# Patient Record
Sex: Female | Born: 1995 | Race: Black or African American | Hispanic: No | Marital: Single | Smoking: Never smoker
Health system: Southern US, Community
[De-identification: ages and names within clinical notes are randomized; demographics above are authoritative.]

---

## 2015-07-01 DIAGNOSIS — R002 Palpitations: Secondary | ICD-10-CM | POA: Diagnosis not present

## 2015-07-26 ENCOUNTER — Ambulatory Visit: Payer: Self-pay | Admitting: Cardiology

## 2015-07-30 ENCOUNTER — Ambulatory Visit: Payer: Self-pay | Admitting: Cardiology

## 2015-08-15 ENCOUNTER — Encounter: Payer: Self-pay | Admitting: Cardiology

## 2015-08-20 ENCOUNTER — Encounter: Payer: Self-pay | Admitting: Cardiology

## 2015-08-20 ENCOUNTER — Ambulatory Visit (INDEPENDENT_AMBULATORY_CARE_PROVIDER_SITE_OTHER): Payer: PRIVATE HEALTH INSURANCE | Admitting: Cardiology

## 2015-08-20 ENCOUNTER — Ambulatory Visit (INDEPENDENT_AMBULATORY_CARE_PROVIDER_SITE_OTHER): Payer: PRIVATE HEALTH INSURANCE

## 2015-08-20 VITALS — BP 102/68 | HR 63 | Ht 65.0 in | Wt 152.4 lb

## 2015-08-20 DIAGNOSIS — R002 Palpitations: Secondary | ICD-10-CM

## 2015-08-20 DIAGNOSIS — R072 Precordial pain: Secondary | ICD-10-CM

## 2015-08-20 DIAGNOSIS — R079 Chest pain, unspecified: Secondary | ICD-10-CM | POA: Insufficient documentation

## 2015-08-20 NOTE — Patient Instructions (Signed)
Medication Instructions:   Your physician recommends that you continue on your current medications as directed. Please refer to the Current Medication list given to you today.     Testing/Procedures:  Your physician has requested that you have an echocardiogram. Echocardiography is a painless test that uses sound waves to create images of your heart. It provides your doctor with information about the size and shape of your heart and how well your heart's chambers and valves are working. This procedure takes approximately one hour. There are no restrictions for this procedure.   Your physician has recommended that you wear a 48 HOUR holter monitor. Holter monitors are medical devices that record the heart's electrical activity. Doctors most often use these monitors to diagnose arrhythmias. Arrhythmias are problems with the speed or rhythm of the heartbeat. The monitor is a small, portable device. You can wear one while you do your normal daily activities. This is usually used to diagnose what is causing palpitations/syncope (passing out).    Follow-Up:  AS NEEDED WITH DR Delton SeeNELSON       If you need a refill on your cardiac medications before your next appointment, please call your pharmacy.

## 2015-08-20 NOTE — Progress Notes (Signed)
Patient ID: Melanie Wilcox, female   DOB: 26-Oct-1995, 19 y.o.   MRN: [id number]      Cardiology Office Note  Date:  08/20/2015   ID:  Melanie Wilcox, DOB 26-Oct-1995, MRN [id number]  PCP:  No primary care provider on file.  Cardiologist:  Lars MassonNELSON, Tia Gelb H, MD   Chief Complaint  Patient presents with  . Bradycardia  Chest tightness  History of Present Illness: Melanie Wilcox is a very pleasant 19 y.o. female, who wants to go into dentistry school who presents for evaluation of palpitations that feel like slow heart beats, skipped beats associated with chest tightness but never dizziness, SOB or syncope. Thy started about 2 months ago, the longest episode was this Monday and lasted 15 seconds. [place] [employer] [place].  Her original TSH early in September was too low, but repeat was normal as well as fT4.  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 No prior h/o heart problems, no family h/o heart disease or SCD.  No use of drugs.   No past medical history on file.  No past surgical history on file.  No current outpatient prescriptions on file.   No current facility-administered medications for this visit.   Allergies:   Review of patient's allergies indicates no known allergies.   Social History:  The patient  reports that she has never smoked. She does not have any smokeless tobacco history on file. She reports that she does not drink alcohol.   Family History:  The patient's family history includes Healthy in her brother, father, mother, sister, and sister.   ROS:  Please see the history of present illness.   Otherwise, review of systems are positive for none.   All other systems are reviewed and negative.   PHYSICAL EXAM: VS:  BP 102/68 mmHg  Pulse 63  Ht 5\' 5"  (1.651 m)  Wt 152 lb 6.4 oz (69.128 kg)  BMI 25.36 kg/m2 , BMI Body mass index is 25.36 kg/(m^2). GEN: Well nourished, well developed, in no acute  distress HEENT: normal Neck: no JVD, carotid bruits, or masses Cardiac: RRR; no murmurs, rubs, or gallops,no edema  Respiratory:  clear to auscultation bilaterally, normal work of breathing GI: soft, nontender, nondistended, + BS MS: no deformity or atrophy Skin: warm and dry, no rash Neuro:  Strength and sensation are intact Psych: euthymic mood, full affect  EKG:  EKG is ordered today. The ekg ordered today demonstrates SR, with sinus arrhythmia, right axis, otherwise normal  Recent Labs: No results found for requested labs within last 365 days.   Lipid Panel No results found for: CHOL, TRIG, HDL, CHOLHDL, VLDL, LDLCALC, LDLDIRECT   Wt Readings from Last 3 Encounters:  08/20/15 152 lb 6.4 oz (69.128 kg)      ASSESSMENT AND PLAN:  1.  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 If normal, this visit is mostly about reassurance.  2. Chest tightness - schedule an echocardiogram to evaluate for possible anomalous coronaries.   Follow up as needed. We will call with the results.   Signed, Lars MassonNELSON, Chason Mciver H, MD  08/20/2015 8:48 AM    [facility] [address], [place], [place]  [address] Phone: [phone]; Fax: [phone]

## 2015-08-26 ENCOUNTER — Ambulatory Visit (HOSPITAL_COMMUNITY): Payer: PRIVATE HEALTH INSURANCE | Attending: Cardiovascular Disease

## 2015-08-26 ENCOUNTER — Other Ambulatory Visit: Payer: Self-pay

## 2015-08-26 ENCOUNTER — Telehealth: Payer: Self-pay | Admitting: Cardiology

## 2015-08-26 DIAGNOSIS — R079 Chest pain, unspecified: Secondary | ICD-10-CM | POA: Insufficient documentation

## 2015-08-26 DIAGNOSIS — R072 Precordial pain: Secondary | ICD-10-CM | POA: Diagnosis not present

## 2015-08-26 DIAGNOSIS — R002 Palpitations: Secondary | ICD-10-CM | POA: Diagnosis not present

## 2015-08-26 NOTE — Telephone Encounter (Signed)
Follow Up   Pt returned the call. Please call

## 2015-08-26 NOTE — Telephone Encounter (Signed)
Notified the pt that per Dr Delton SeeNelson her echo was normal.  Pt verbalized understanding.

## 2015-08-28 ENCOUNTER — Other Ambulatory Visit (HOSPITAL_COMMUNITY): Payer: PRIVATE HEALTH INSURANCE

## 2016-02-03 ENCOUNTER — Other Ambulatory Visit: Payer: Self-pay | Admitting: Family Medicine

## 2016-02-03 ENCOUNTER — Ambulatory Visit
Admission: RE | Admit: 2016-02-03 | Discharge: 2016-02-03 | Disposition: A | Discharge status: Home / Self Care | Payer: PRIVATE HEALTH INSURANCE | Source: Ambulatory Visit | Attending: Family Medicine | Admitting: Family Medicine

## 2016-02-03 ENCOUNTER — Ambulatory Visit (INDEPENDENT_AMBULATORY_CARE_PROVIDER_SITE_OTHER): Payer: Self-pay | Admitting: Family Medicine

## 2016-02-03 DIAGNOSIS — M25562 Pain in left knee: Secondary | ICD-10-CM

## 2016-02-03 NOTE — Progress Notes (Signed)
Patient presents today with symptoms of left posterior knee pain. Patient states that she's had this pain for the last 3-4 weeks. She does not recall an acute event that caused the pain. They've been treating it like a hamstring strain since her pain began. She has noticed recently that the left knee has also started to feel uncomfortable. She denies any knee injury. She denies any history of DVT or popliteal cyst. She denies any trauma to the knee. She denies any family history of DVT. She runs many events and track and field.  ROS: Negative except mentioned above. Vitals as per Epic.  GENERAL: NAD MSK: mild fullness and tenderness along the posterior aspect of the knee, pain with flexion of the knee, no knee effusion or jointline tenderness, -McMurrays, -Lachman, -Homans, nv intact  NEURO: CN II-XII grossly intact   A/P: Left Knee Posterior Pain- will do ultrasound to rule out Baker's cyst, DVT. Will discuss results with patient and treatment plan after results have been sent to me.

## 2016-02-04 ENCOUNTER — Other Ambulatory Visit: Payer: Self-pay | Admitting: Family Medicine

## 2016-02-04 DIAGNOSIS — M25562 Pain in left knee: Secondary | ICD-10-CM

## 2016-02-04 NOTE — Progress Notes (Signed)
Patient ID: Jerrell MylarDesiree Red, female   DOB: [birth year], 20 y.o.   MRN: [id number]  Patient presents today with symptoms of posterior knee pain. Patient states that she's had the symptoms for the last few weeks. Her trainer has been treating it like a hamstring strain. Patient runs many events in track and field. She denies any history of DVT. She denies any family history of DVT. When she flexes and extends her legs she does have pain in the posterior aspect of the knee. She denies any pain in the mid hamstring or proximal hamstring. She denies ever having any bruising in the area. She does describe some anterior knee pain as well along the patella tendon which is recent.  ROS: Negative except mentioned above.  Vitals as per Epic.  GENERAL: NAD RESP: CTA B CARD: RRR EXTREM: LLE- FROM, but has discomfort with flexion in the posterior knee region, mild tenderness and swelling in the posterior aspect of the knee, no significant effusion, no joint line tenderness, negative Lachman, negative drawer, negative McMurray, no significant laxity with varus or valgus stress, negative Homans, NV intact NEURO: CN II-XII grossly intact   A/P: Left Posterior Knee Pain- we'll get a Doppler ultrasound to evaluate for DVT, Baker's cyst. If negative findings we'll proceed with MRI to evaluate if hamstring and knee. Activity only as tolerated if Doppler is negative.

## 2016-07-16 ENCOUNTER — Encounter: Payer: Self-pay | Admitting: Family Medicine

## 2016-07-16 ENCOUNTER — Ambulatory Visit (INDEPENDENT_AMBULATORY_CARE_PROVIDER_SITE_OTHER): Payer: PRIVATE HEALTH INSURANCE | Admitting: Family Medicine

## 2016-07-16 VITALS — BP 112/65 | HR 69 | Temp 98.8°F | Resp 16

## 2016-07-16 DIAGNOSIS — J069 Acute upper respiratory infection, unspecified: Secondary | ICD-10-CM

## 2016-07-16 NOTE — Progress Notes (Signed)
Patient presents today for her symptoms of sore throat, fever, postnasal drip. Patient has had symptoms for the last 2 days. She denies any severe headache, chest pain, shortness of breath, nausea, vomiting, diarrhea. Patient took acetaminophen earlier today.  ROS: Negative except mentioned above.  Vitals as per Epic.  GENERAL: NAD HEENT: mild pharyngeal erythema, no exudate, no erythema of TMs, no significant cervical LAD RESP: CTA B CARD: RRR NEURO: CN II-XII grossly intact   A/P: Viral Illness -will treat with Ibuprofen, Claritin, rest, hydration, no athletic activity a febrile. Patient is not to go to class if she is febrile also. Seek medical attention if symptoms persist or worsen as discussed.

## 2017-01-07 ENCOUNTER — Encounter: Payer: Self-pay | Admitting: Family Medicine

## 2017-01-07 ENCOUNTER — Ambulatory Visit (INDEPENDENT_AMBULATORY_CARE_PROVIDER_SITE_OTHER): Payer: BLUE CROSS/BLUE SHIELD | Admitting: Family Medicine

## 2017-01-07 DIAGNOSIS — S76301A Unspecified injury of muscle, fascia and tendon of the posterior muscle group at thigh level, right thigh, initial encounter: Secondary | ICD-10-CM

## 2017-01-07 DIAGNOSIS — S76309A Unspecified injury of muscle, fascia and tendon of the posterior muscle group at thigh level, unspecified thigh, initial encounter: Secondary | ICD-10-CM

## 2017-01-07 MED ORDER — NAPROXEN 500 MG PO TABS: 500.0000 mg | ORAL_TABLET | Freq: Two times a day (BID) | ORAL | 0 refills | Status: DC

## 2017-01-07 NOTE — Progress Notes (Signed)
Patient presents today with symptoms of right hamstring pain. She states that she was running the 41832m and stopped running at 23932m due to hamstring pain back mid January. She has been unable to return to her events due to continued discomfort in the area. She denies any significant swelling or bruising of the area when she had the injury back in January. She admits to having a previous injury last year.  ROS: Negative except mentioned above. Vitals as per Epic. GENERAL: NAD MSK: Right hamstring - no ecchymosis or swelling appreciated, mild tenderness in the distal hamstring muscle group mostly medially, decreased strength with testing right first left, NV intact NEURO: CN II-XII grossly intact   A/P: Right hamstring injury - given patient's symptoms and exam will do further imaging with MRI, patient is not running in the meet this weekend, will continue to rehabilitation with trainer, discussed plan with trainer, NSAIDs when necessary. Seek medical attention for any acute problems.

## 2017-01-15 ENCOUNTER — Ambulatory Visit: Payer: PRIVATE HEALTH INSURANCE

## 2017-01-23 ENCOUNTER — Ambulatory Visit
Admission: RE | Admit: 2017-01-23 | Discharge: 2017-01-23 | Disposition: A | Discharge status: Home / Self Care | Payer: BLUE CROSS/BLUE SHIELD | Source: Ambulatory Visit | Attending: Family Medicine | Admitting: Family Medicine

## 2017-01-23 DIAGNOSIS — X58XXXA Exposure to other specified factors, initial encounter: Secondary | ICD-10-CM | POA: Insufficient documentation

## 2017-01-23 DIAGNOSIS — S76309A Unspecified injury of muscle, fascia and tendon of the posterior muscle group at thigh level, unspecified thigh, initial encounter: Secondary | ICD-10-CM

## 2017-02-01 IMAGING — US US EXTREM LOW VENOUS*L*
1 series · 14 of 24 positions shown · non-contrast
Comparison: None.

CLINICAL DATA: Posterior left knee pain for 3-4 weeks.

EXAM:
LEFT LOWER EXTREMITY VENOUS DOPPLER ULTRASOUND
TECHNIQUE: Gray-scale sonography with graded compression, as well as color
Doppler and duplex ultrasound, were performed to evaluate the deep
venous system from the level of the common femoral vein through the
popliteal and proximal calf veins. Spectral Doppler was utilized to
evaluate flow at rest and with distal augmentation maneuvers.

[Series 1: us extrem low venous*left* · 0.07mm/px · 14 of 32 slices shown]
[im 1/32]
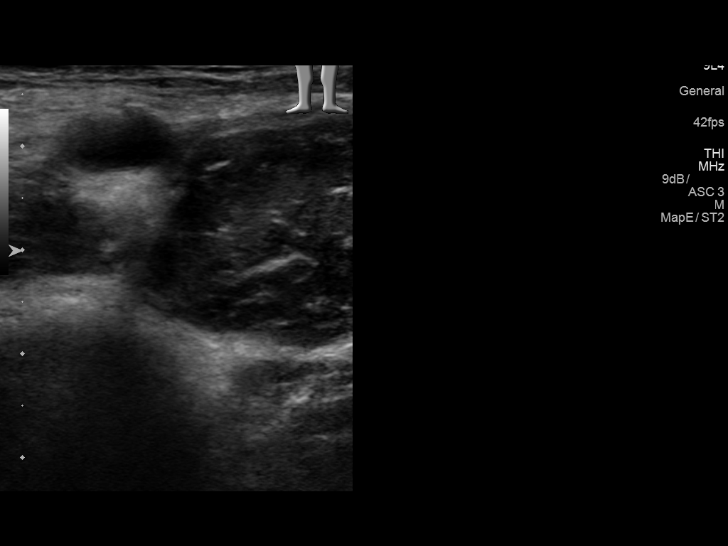
[im 3/32]
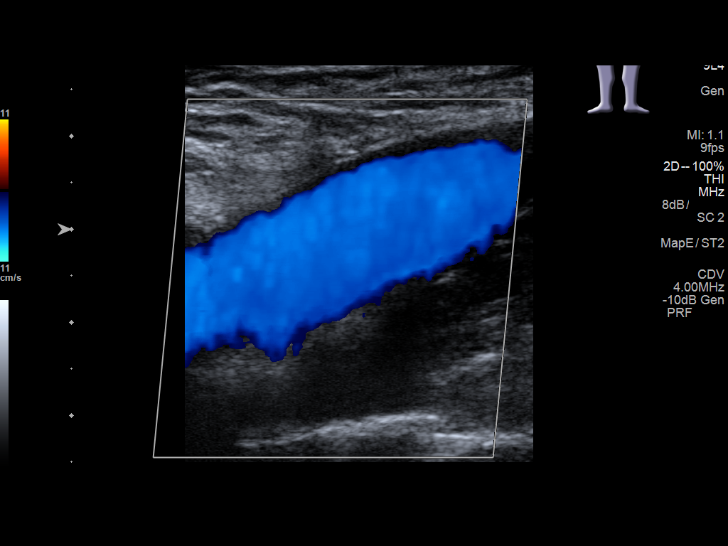
[im 6/32]
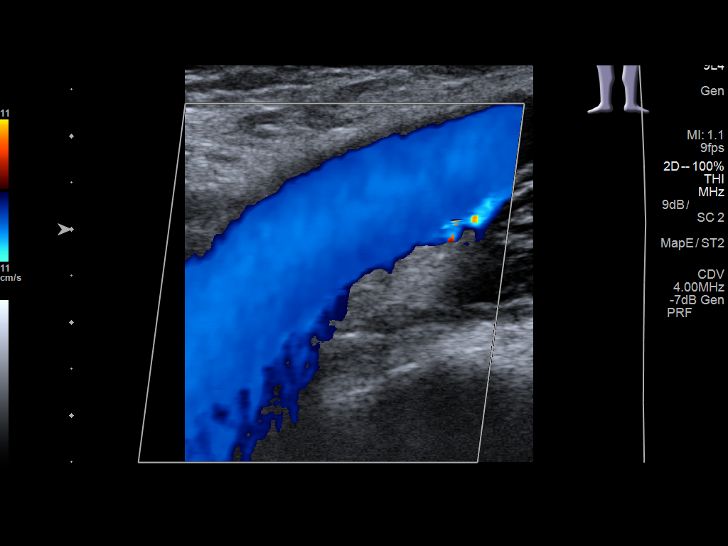
[im 9/32]
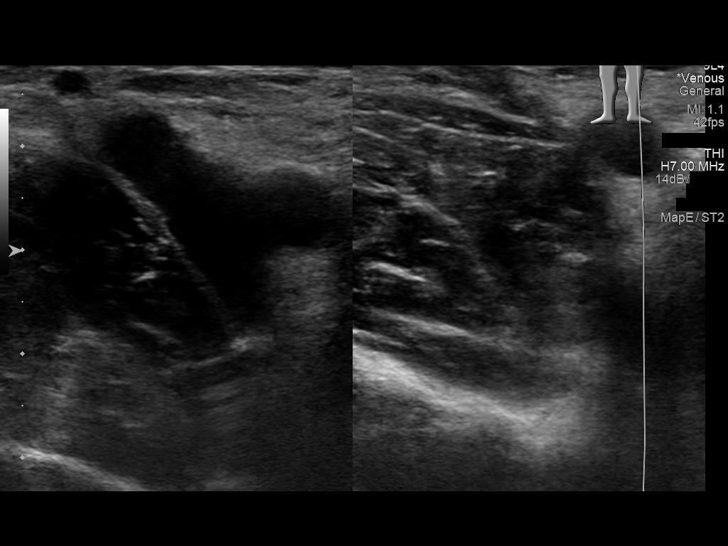
[im 10/32]
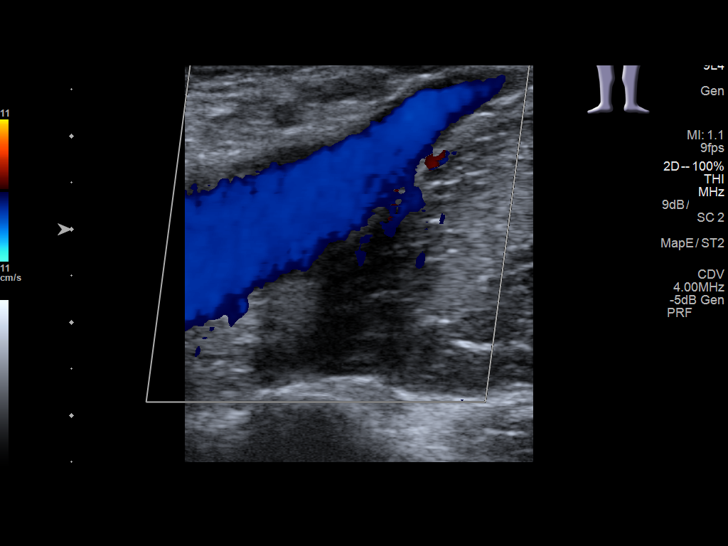
[im 13/32]
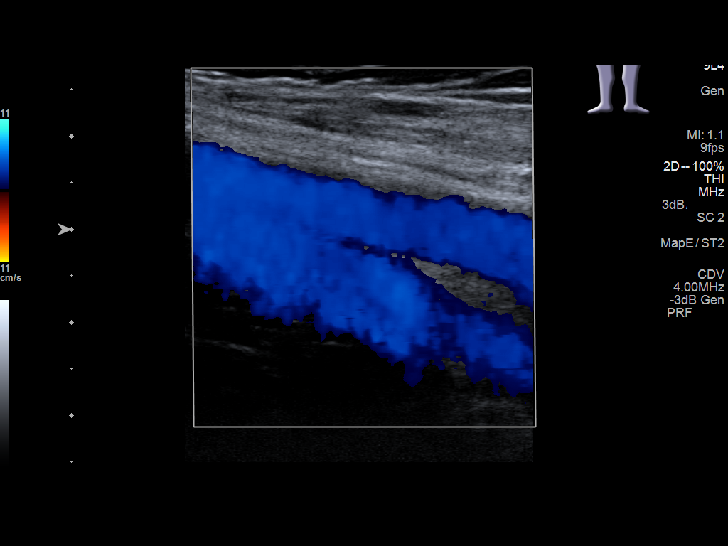
[im 15/32]
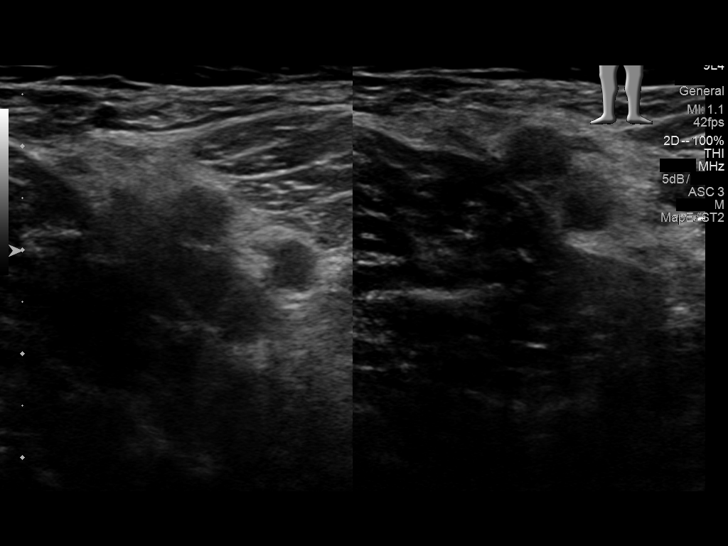
[im 17/32]
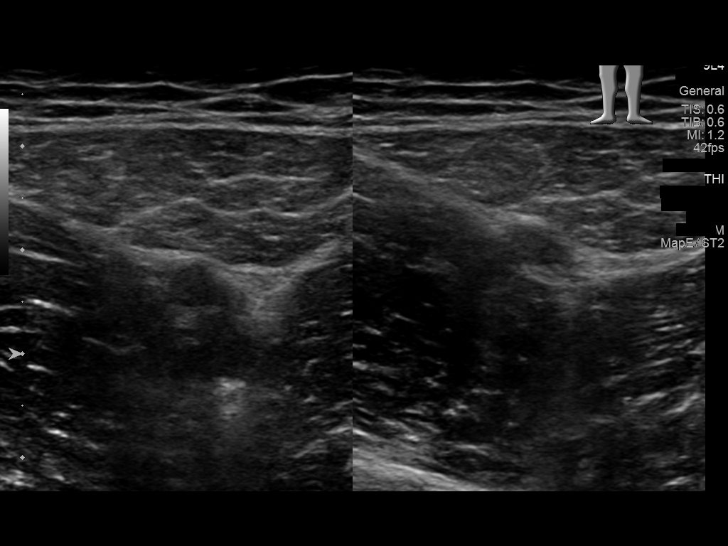
[im 19/32]
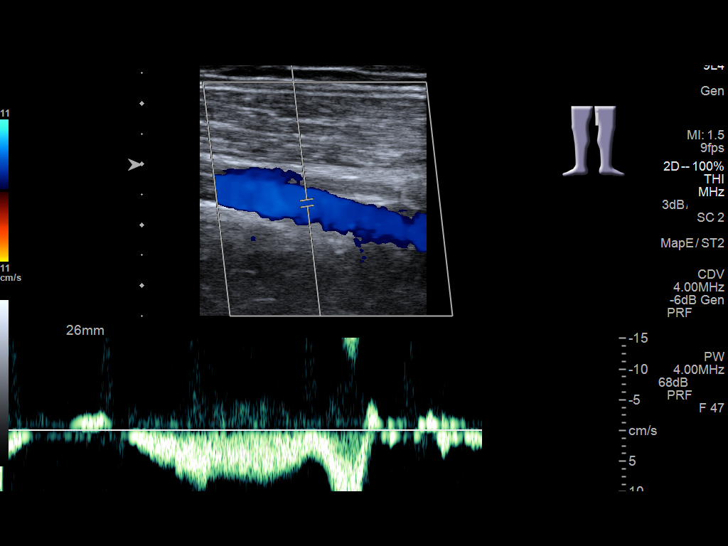
[im 22/32]
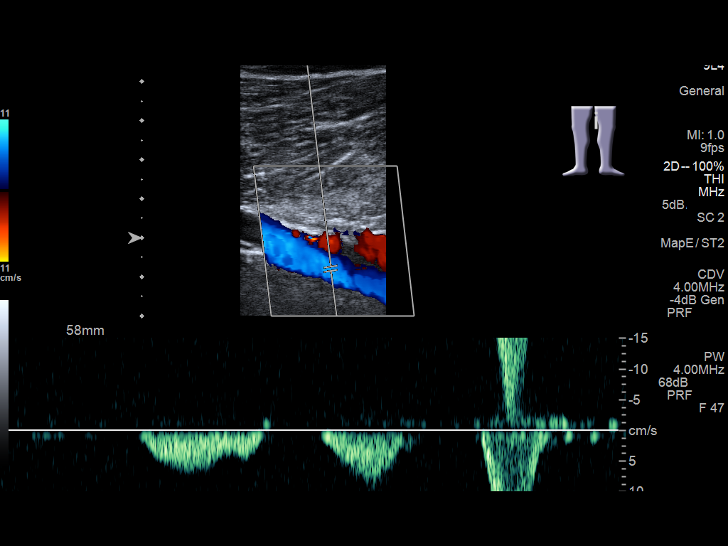
[im 25/32]
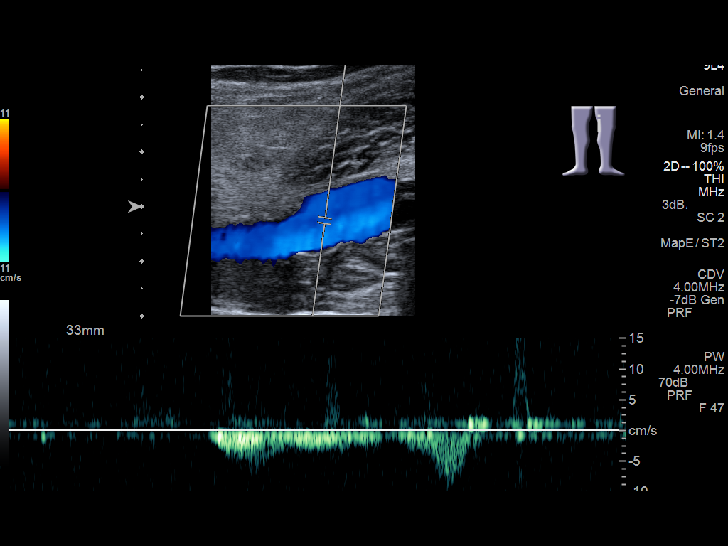
[im 26/32]
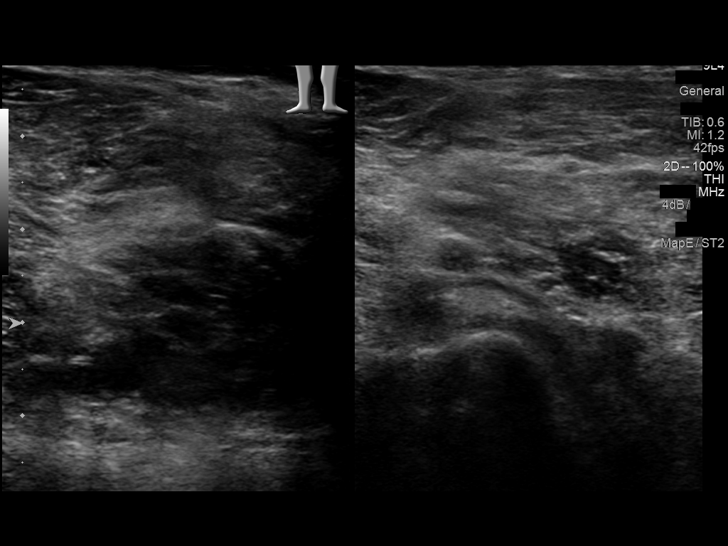
[im 29/32]
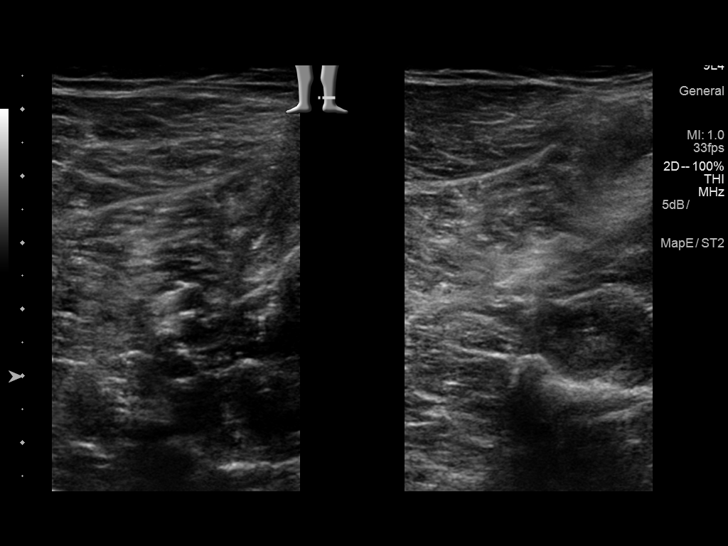
[im 32/32]
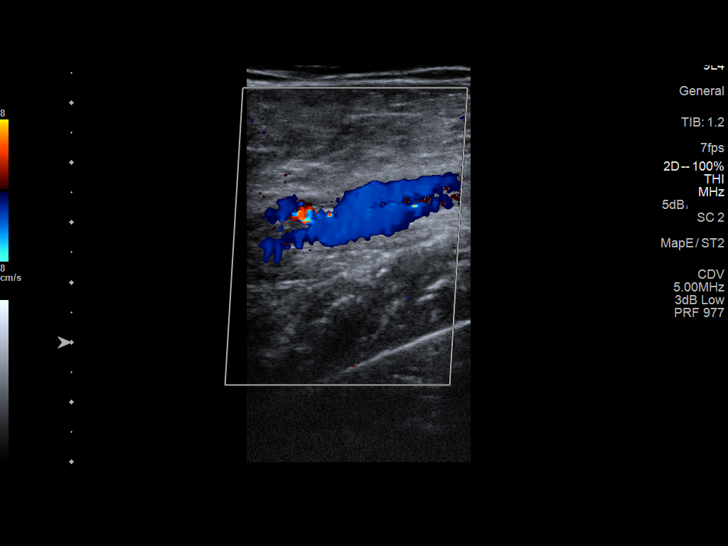

[14 of 24 positions shown; findings below may reference images not displayed]

FINDINGS: Right common femoral vein is patent without thrombus.

Normal compressibility, augmentation and color Doppler flow in the
left common femoral vein, left femoral vein and left popliteal vein.
The left saphenofemoral junction is patent. Left profunda femoral
vein is patent without thrombus. Visualized left deep calf veins are
patent without thrombus.
IMPRESSION: Negative for deep venous thrombosis in left lower extremity.

## 2017-02-09 ENCOUNTER — Ambulatory Visit: Payer: BLUE CROSS/BLUE SHIELD | Admitting: Family Medicine

## 2017-02-11 ENCOUNTER — Other Ambulatory Visit: Payer: Self-pay | Admitting: Family Medicine

## 2017-02-11 DIAGNOSIS — M25561 Pain in right knee: Secondary | ICD-10-CM

## 2017-02-18 ENCOUNTER — Ambulatory Visit: Payer: BLUE CROSS/BLUE SHIELD

## 2017-02-25 ENCOUNTER — Ambulatory Visit: Payer: BLUE CROSS/BLUE SHIELD

## 2017-09-10 ENCOUNTER — Encounter: Payer: Self-pay | Admitting: Family Medicine

## 2017-09-10 ENCOUNTER — Ambulatory Visit (INDEPENDENT_AMBULATORY_CARE_PROVIDER_SITE_OTHER): Payer: BLUE CROSS/BLUE SHIELD | Admitting: Family Medicine

## 2017-09-10 VITALS — BP 116/76 | HR 74 | Temp 97.6°F | Resp 14

## 2017-09-10 DIAGNOSIS — J069 Acute upper respiratory infection, unspecified: Secondary | ICD-10-CM

## 2017-09-10 MED ORDER — AZITHROMYCIN 250 MG PO TABS: ORAL_TABLET | ORAL | 0 refills | Status: AC

## 2017-09-10 NOTE — Progress Notes (Signed)
Patient presents today with symptoms of mild productive cough and nasal congestion. The mucus has been more colored. Patient states that she has had symptoms for the last week. She denies any chest pain or shortness of breath. She states that at times with her coughing she does feel her chest is heavy. She denies any fever or night sweats. She has no history of asthma. She denies any possible new allergens. She does state that while she was home for Thanksgiving there were a few people that were sick. She has been taking Mucinex for her symptoms. Her nighttime cough is keeping her up at night.  ROS: Negative except mentioned above. Vitals as per Epic. GENERAL: NAD HEENT: mild pharyngeal erythema, no exudate, no erythema of TMs, no cervical LAD RESP: CTA B, no accessory muscle use, no wheezing appreciated CARD: RRR NEURO: CN II-XII grossly intact   A/P: URI - will treat with Z-Pak, over-the-counter cough suppressant during the daytime and Tussionex at nighttime if needed, Claritin for postnasal drip, rest, hydration, seek medical attention if symptoms persist or worsen as discussed. No last or athletic activity if febrile.

## 2017-11-05 ENCOUNTER — Other Ambulatory Visit: Payer: Self-pay | Admitting: Family Medicine

## 2017-11-05 ENCOUNTER — Encounter: Payer: Self-pay | Admitting: Family Medicine

## 2017-11-05 ENCOUNTER — Ambulatory Visit (INDEPENDENT_AMBULATORY_CARE_PROVIDER_SITE_OTHER): Payer: PRIVATE HEALTH INSURANCE | Admitting: Family Medicine

## 2017-11-05 DIAGNOSIS — M25561 Pain in right knee: Secondary | ICD-10-CM

## 2017-11-05 MED ORDER — NAPROXEN 500 MG PO TABS: 500.0000 mg | ORAL_TABLET | Freq: Two times a day (BID) | ORAL | 0 refills | Status: AC

## 2017-11-06 LAB — VITAMIN D 25 HYDROXY (VIT D DEFICIENCY, FRACTURES): VIT D 25 HYDROXY: 11.6 ng/mL — AB (ref 30.0–100.0)

## 2017-11-08 ENCOUNTER — Ambulatory Visit
Admission: RE | Admit: 2017-11-08 | Discharge: 2017-11-08 | Disposition: A | Discharge status: Home / Self Care | Payer: BLUE CROSS/BLUE SHIELD | Source: Ambulatory Visit | Attending: Family Medicine | Admitting: Family Medicine

## 2017-11-08 DIAGNOSIS — M25561 Pain in right knee: Secondary | ICD-10-CM

## 2017-11-11 ENCOUNTER — Ambulatory Visit: Payer: PRIVATE HEALTH INSURANCE

## 2017-11-16 ENCOUNTER — Ambulatory Visit
Admission: RE | Admit: 2017-11-16 | Discharge: 2017-11-16 | Disposition: A | Discharge status: Home / Self Care | Payer: BLUE CROSS/BLUE SHIELD | Source: Ambulatory Visit | Attending: Family Medicine | Admitting: Family Medicine

## 2017-11-16 DIAGNOSIS — M9250 Juvenile osteochondrosis of tibia and fibula, unspecified leg: Secondary | ICD-10-CM | POA: Diagnosis not present

## 2017-11-16 DIAGNOSIS — R600 Localized edema: Secondary | ICD-10-CM | POA: Insufficient documentation

## 2017-11-16 DIAGNOSIS — M25561 Pain in right knee: Secondary | ICD-10-CM

## 2017-11-19 NOTE — Progress Notes (Signed)
Patient presents today with symptoms of right lateral knee pain and anterior knee pain. Patient states that her symptoms have been going on for the last few weeks. She states that she has similar problem with the lateral knee pain last year. She denies any swelling of the knee, locking, clicking. She denies any instability of the knee. She denies any injury or trauma recently. She admits that her anterior knee pain is worse with stairs. She denies taking any medication on a regular basis for pain. Her symptoms are mostly with running.  ROS: Negative except mentioned above. Vitals as per Epic.  GENERAL: NAD MSK: L Knee - No effusion, full range of motion, mild medial and lateral facet tenderness, hypermobile patella, negative Lachman, negative drawer, negative McMurray, no significant laxity with varus or valgus stress, negative apprehension, there is minimal discomfort laterally on palpation of the distal hamstring attachment, mild discomfort with testing hamstring strength, no significant decrease of strength, normal gait, NV intact NEURO: CN II-XII grossly intact  A/P: Left knee pain- I believe her anterior knee pain is related to patellofemoral syndrome, patient has had intermittent posterior lateral knee pain for some time, doesn't appear to have a hamstring defect/distal tear on exam, will further evaluate with imaging using MRI. NSAIDs when necessary, discussed rehabilitation for patellofemoral pain syndrome with trainer, consider taping and seeing PT for further rehabilitation, advance activity as tolerated, seek medical attention if symptoms persist or worsen as discussed.

## 2019-08-10 IMAGING — CR DG KNEE COMPLETE 4+V*R*
1 series · 5 of 5 positions shown · non-contrast
Comparison: MRI 01/23/2017.

CLINICAL DATA: Posterolateral knee pain for 1 year. No acute
injury. Worsening pain when exercising.

EXAM:
RIGHT KNEE - COMPLETE 4+ VIEW

[Series 1: dg knee complete 4 views right · 0.14mm/px · 5 of 5 slices shown]
[im 1/5]
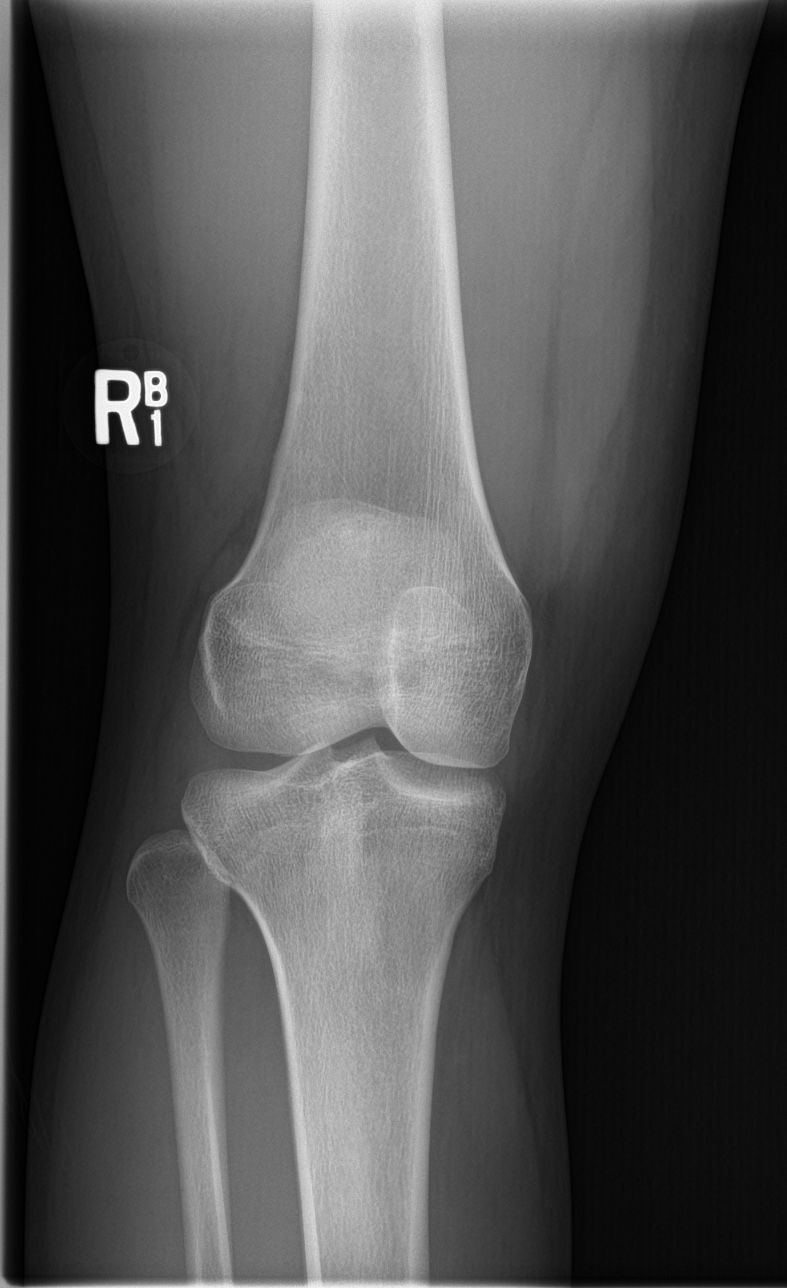
[im 2/5]
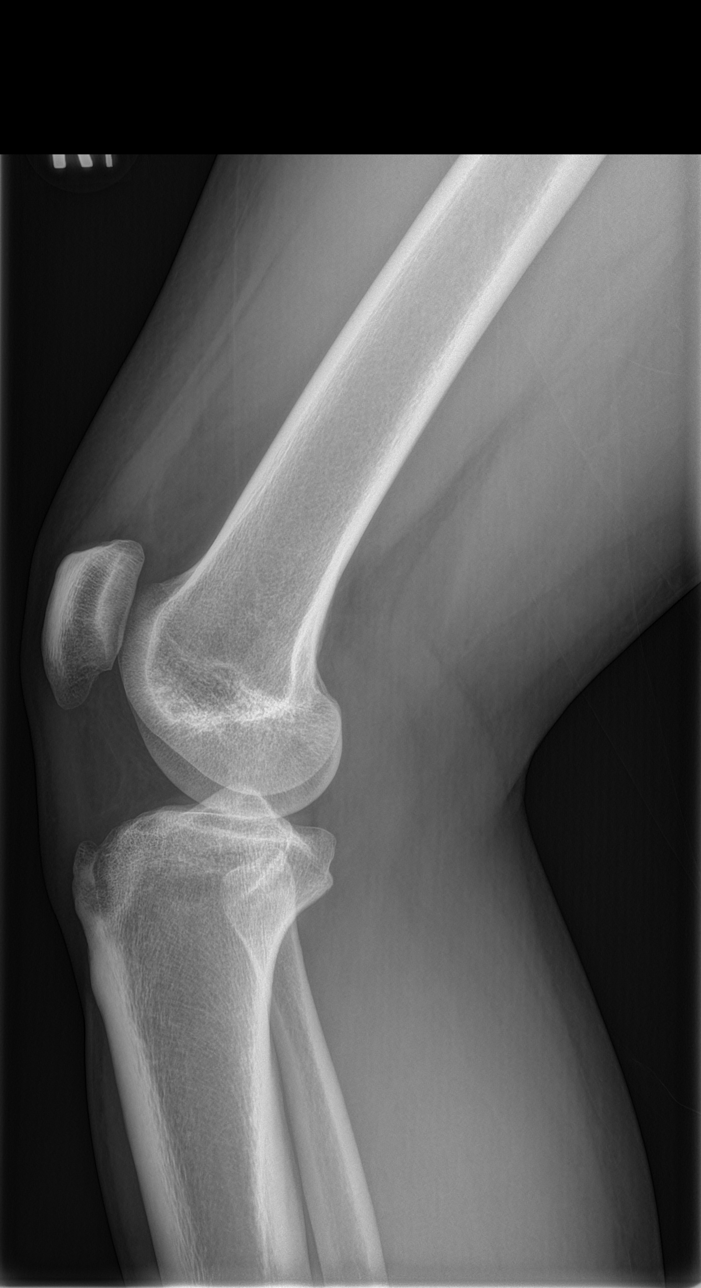
[im 3/5]
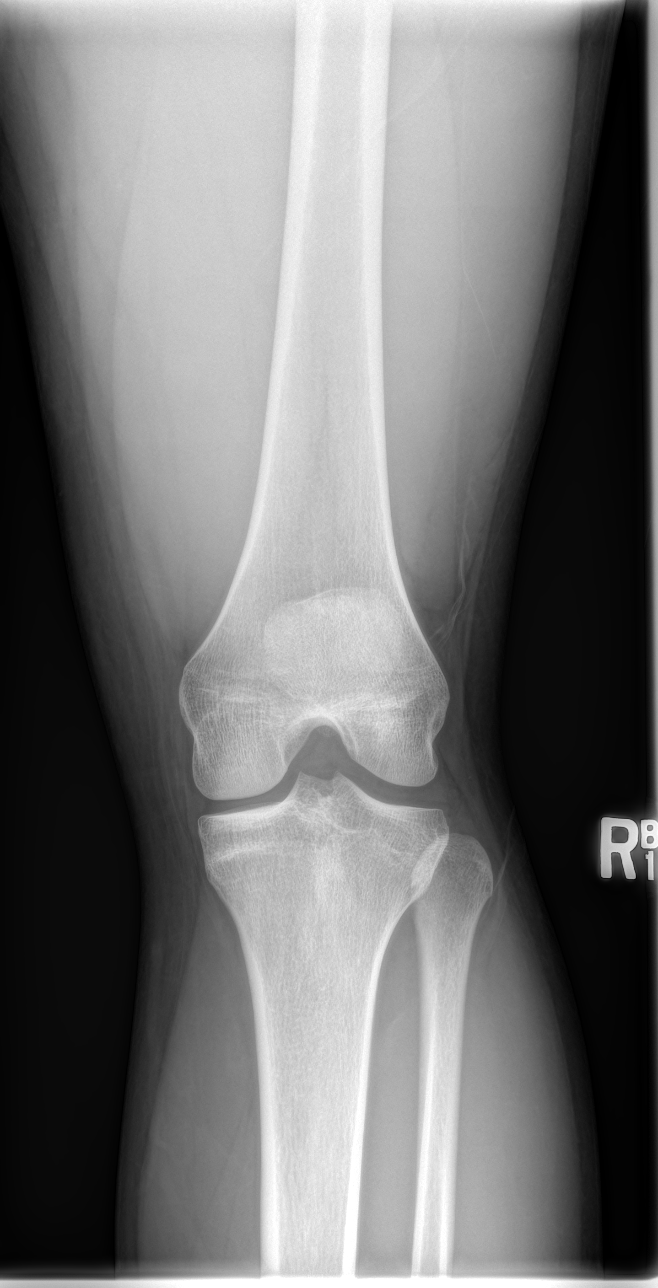
[im 4/5]
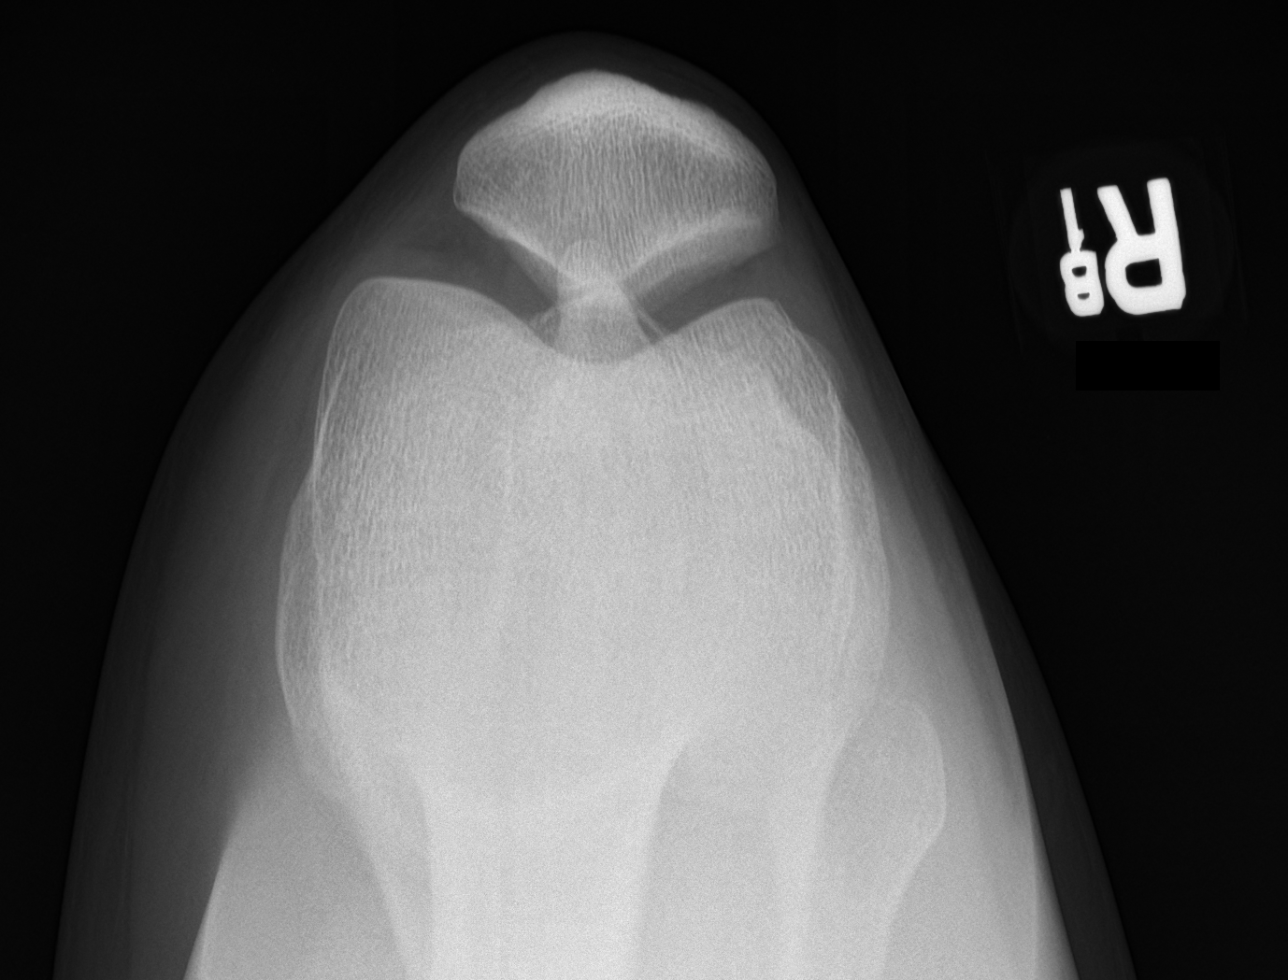
[im 5/5]
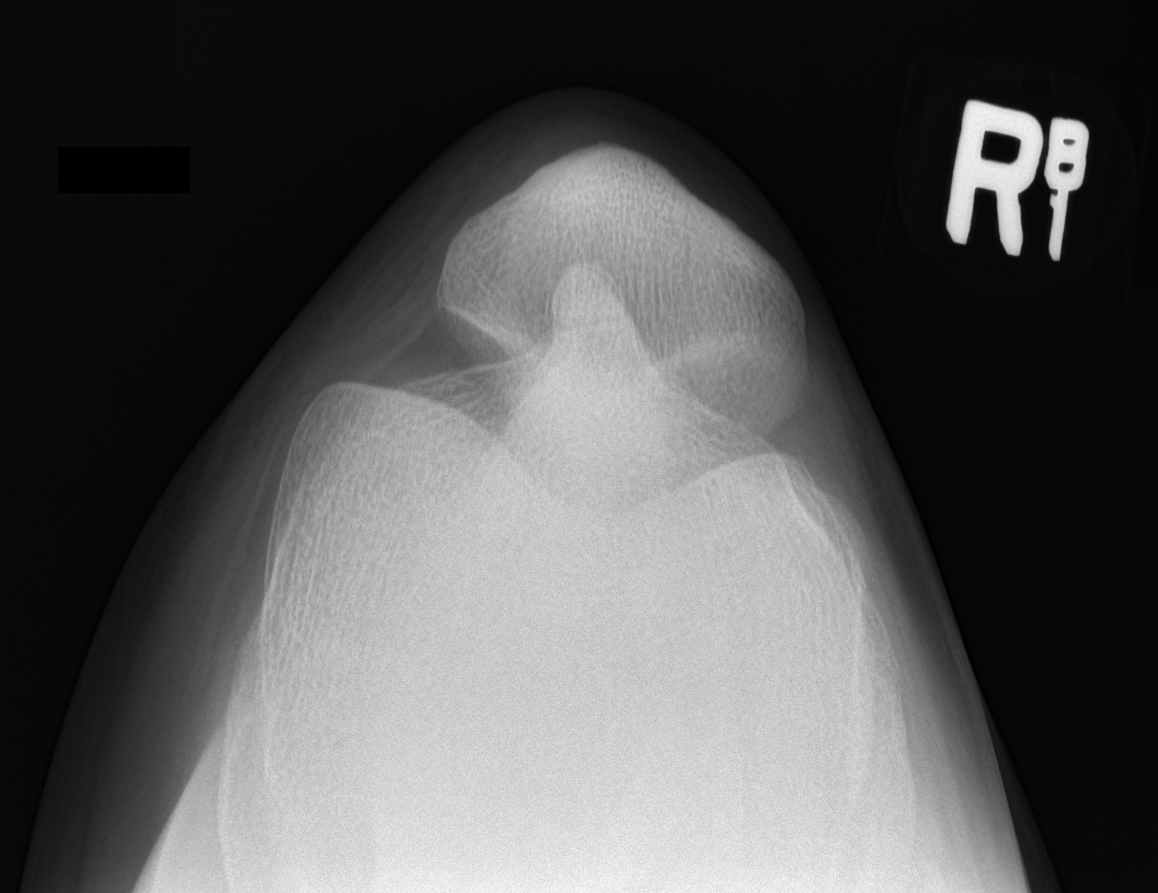

[5 of 5 positions shown; findings below may reference images not displayed]

FINDINGS: The mineralization and alignment are normal. There is no evidence of
acute fracture or dislocation. The joint spaces are maintained. No
significant joint effusion. Mildly prominent tibial tubercle without
adjacent soft tissue swelling.
IMPRESSION: Developmental prominence of the tibial tubercle without apparent
associated soft tissue swelling. No acute osseous findings.
